# Patient Record
Sex: Male | Born: 1950 | Race: White | Hispanic: No | Marital: Single | Smoking: Former smoker
Health system: Southern US, Community
[De-identification: ages and names within clinical notes are randomized; demographics above are authoritative.]

## PROBLEM LIST (undated history)

## (undated) DIAGNOSIS — E079 Disorder of thyroid, unspecified: Secondary | ICD-10-CM

## (undated) DIAGNOSIS — I10 Essential (primary) hypertension: Secondary | ICD-10-CM

## (undated) HISTORY — PX: REPLACEMENT TOTAL KNEE: SUR1224

## (undated) HISTORY — PX: CHOLECYSTECTOMY: SHX55

## (undated) HISTORY — PX: BACK SURGERY: SHX140

## (undated) HISTORY — PX: ESOPHAGUS SURGERY: SHX626

---

## 2013-07-27 ENCOUNTER — Emergency Department (HOSPITAL_COMMUNITY): Payer: Self-pay

## 2013-07-27 ENCOUNTER — Emergency Department (HOSPITAL_COMMUNITY)
Admission: EM | Admit: 2013-07-27 | Discharge: 2013-07-27 | Disposition: A | Discharge status: Home / Self Care | Payer: Self-pay | Attending: Emergency Medicine | Admitting: Emergency Medicine

## 2013-07-27 ENCOUNTER — Encounter (HOSPITAL_COMMUNITY): Payer: Self-pay | Admitting: Emergency Medicine

## 2013-07-27 DIAGNOSIS — T401X1A Poisoning by heroin, accidental (unintentional), initial encounter: Secondary | ICD-10-CM | POA: Insufficient documentation

## 2013-07-27 DIAGNOSIS — Y9389 Activity, other specified: Secondary | ICD-10-CM | POA: Insufficient documentation

## 2013-07-27 DIAGNOSIS — I10 Essential (primary) hypertension: Secondary | ICD-10-CM | POA: Insufficient documentation

## 2013-07-27 DIAGNOSIS — G4730 Sleep apnea, unspecified: Secondary | ICD-10-CM | POA: Insufficient documentation

## 2013-07-27 DIAGNOSIS — F1910 Other psychoactive substance abuse, uncomplicated: Secondary | ICD-10-CM

## 2013-07-27 DIAGNOSIS — I498 Other specified cardiac arrhythmias: Secondary | ICD-10-CM | POA: Insufficient documentation

## 2013-07-27 DIAGNOSIS — E079 Disorder of thyroid, unspecified: Secondary | ICD-10-CM | POA: Insufficient documentation

## 2013-07-27 DIAGNOSIS — IMO0002 Reserved for concepts with insufficient information to code with codable children: Secondary | ICD-10-CM | POA: Insufficient documentation

## 2013-07-27 DIAGNOSIS — R61 Generalized hyperhidrosis: Secondary | ICD-10-CM | POA: Insufficient documentation

## 2013-07-27 DIAGNOSIS — I499 Cardiac arrhythmia, unspecified: Secondary | ICD-10-CM

## 2013-07-27 DIAGNOSIS — Z79899 Other long term (current) drug therapy: Secondary | ICD-10-CM | POA: Insufficient documentation

## 2013-07-27 DIAGNOSIS — Z87891 Personal history of nicotine dependence: Secondary | ICD-10-CM | POA: Insufficient documentation

## 2013-07-27 DIAGNOSIS — Y929 Unspecified place or not applicable: Secondary | ICD-10-CM | POA: Insufficient documentation

## 2013-07-27 DIAGNOSIS — T401X4A Poisoning by heroin, undetermined, initial encounter: Secondary | ICD-10-CM | POA: Insufficient documentation

## 2013-07-27 HISTORY — DX: Essential (primary) hypertension: I10

## 2013-07-27 HISTORY — DX: Disorder of thyroid, unspecified: E07.9

## 2013-07-27 LAB — CBC WITH DIFFERENTIAL/PLATELET
BASOS PCT: 0 % (ref 0–1)
Basophils Absolute: 0 10*3/uL (ref 0.0–0.1)
Eosinophils Absolute: 0.1 10*3/uL (ref 0.0–0.7)
Eosinophils Relative: 1 % (ref 0–5)
HEMATOCRIT: 43.2 % (ref 39.0–52.0)
HEMOGLOBIN: 15.1 g/dL (ref 13.0–17.0)
LYMPHS ABS: 1.2 10*3/uL (ref 0.7–4.0)
Lymphocytes Relative: 9 % — ABNORMAL LOW (ref 12–46)
MCH: 35 pg — AB (ref 26.0–34.0)
MCHC: 35 g/dL (ref 30.0–36.0)
MCV: 100.2 fL — ABNORMAL HIGH (ref 78.0–100.0)
MONO ABS: 0.6 10*3/uL (ref 0.1–1.0)
MONOS PCT: 5 % (ref 3–12)
Neutro Abs: 11.1 10*3/uL — ABNORMAL HIGH (ref 1.7–7.7)
Neutrophils Relative %: 85 % — ABNORMAL HIGH (ref 43–77)
Platelets: 207 10*3/uL (ref 150–400)
RBC: 4.31 MIL/uL (ref 4.22–5.81)
RDW: 13 % (ref 11.5–15.5)
WBC: 13.1 10*3/uL — ABNORMAL HIGH (ref 4.0–10.5)

## 2013-07-27 LAB — COMPREHENSIVE METABOLIC PANEL WITH GFR
ALT: 36 U/L (ref 0–53)
AST: 29 U/L (ref 0–37)
Albumin: 3.3 g/dL — ABNORMAL LOW (ref 3.5–5.2)
Alkaline Phosphatase: 95 U/L (ref 39–117)
BUN: 19 mg/dL (ref 6–23)
CO2: 31 meq/L (ref 19–32)
Calcium: 9.3 mg/dL (ref 8.4–10.5)
Chloride: 102 meq/L (ref 96–112)
Creatinine, Ser: 1.64 mg/dL — ABNORMAL HIGH (ref 0.50–1.35)
GFR calc Af Amer: 51 mL/min — ABNORMAL LOW
GFR calc non Af Amer: 44 mL/min — ABNORMAL LOW
Glucose, Bld: 136 mg/dL — ABNORMAL HIGH (ref 70–99)
Potassium: 4.6 meq/L (ref 3.7–5.3)
Sodium: 141 meq/L (ref 137–147)
Total Bilirubin: 0.8 mg/dL (ref 0.3–1.2)
Total Protein: 6.3 g/dL (ref 6.0–8.3)

## 2013-07-27 LAB — RAPID URINE DRUG SCREEN, HOSP PERFORMED
Amphetamines: POSITIVE — AB
Barbiturates: NOT DETECTED
Benzodiazepines: NOT DETECTED
Cocaine: POSITIVE — AB
Opiates: POSITIVE — AB
Tetrahydrocannabinol: POSITIVE — AB

## 2013-07-27 LAB — I-STAT CG4 LACTIC ACID, ED: Lactic Acid, Venous: 0.76 mmol/L (ref 0.5–2.2)

## 2013-07-27 LAB — MAGNESIUM: Magnesium: 1.8 mg/dL (ref 1.5–2.5)

## 2013-07-27 LAB — ETHANOL: Alcohol, Ethyl (B): <11 mg/dL (ref 0–11)

## 2013-07-27 LAB — TROPONIN I: Troponin I: <0.3 ng/mL

## 2013-07-27 MED ORDER — MAGNESIUM SULFATE IN D5W 10-5 MG/ML-% IV SOLN
1.0000 g | Freq: Once | INTRAVENOUS | Status: AC
  Administered 2013-07-27: 1 g via INTRAVENOUS
  Filled 2013-07-27: qty 100

## 2013-07-27 MED ORDER — SODIUM CHLORIDE 0.9 % IV BOLUS (SEPSIS)
1000.0000 mL | Freq: Once | INTRAVENOUS | Status: AC
  Administered 2013-07-27: 1000 mL via INTRAVENOUS

## 2013-07-27 NOTE — ED Notes (Signed)
Bed: [id number] Expected date:  Expected time:  Means of arrival:  Comments: EMS Heroin Overdose

## 2013-07-27 NOTE — ED Provider Notes (Signed)
CSN: [id number]     Arrival date & time 07/27/13  0024 History   First MD Initiated Contact with Patient 07/27/13 0048     Chief Complaint  Patient presents with  . Heroin Overdose      (Consider location/radiation/quality/duration/timing/severity/associated sxs/prior Treatment) HPI 63 year old male presents to emergency department via EMS after a heroin overdose.  Patient was accompanied by a friend who is with him at that time.  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  This to using heroin.  Snorted, it tonight.  Patient's friend reports he was unconscious, and barely breathing, turned blue.  Patient was given Narcan 1 mg, and is now back to his baseline.  Patient noted to have bigeminy on the monitor.  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  He denies any chest pain, shortness of breath.  He is diaphoretic. Past Medical History  Diagnosis Date  . Hypertension   . Thyroid disease    Past Surgical History  Procedure Laterality Date  . Esophagus surgery    . Replacement total knee Right   . Cholecystectomy    . Back surgery     History reviewed. No pertinent family history. History  Substance Use Topics  . Smoking status: Former Games developer  . Smokeless tobacco: Never Used  . Alcohol Use: Yes    Review of Systems  See History of Present Illness; otherwise all other systems are reviewed and negative   Allergies  Review of patient's allergies indicates no known allergies.  Home Medications   Current Outpatient Rx  Name  Route  Sig  Dispense  Refill  . citalopram (CELEXA) 40 MG tablet   Oral   Take 40 mg by mouth daily.         Marland Kitchen dextroamphetamine (DEXTROSTAT) 10 MG tablet   Oral   Take 10 mg by mouth 4 (four) times daily.         Marland Kitchen levothyroxine (SYNTHROID, LEVOTHROID) 75 MCG tablet   Oral   Take 75 mcg by mouth daily before breakfast.         . lisinopril-hydrochlorothiazide (PRINZIDE,ZESTORETIC) 20-25 MG per tablet   Oral   Take 1 tablet by mouth daily.         .  methylPREDNISolone (MEDROL DOSEPAK) 4 MG tablet   Oral   Take 4 mg by mouth 2 (two) times daily. follow package directions.         . tadalafil (CIALIS) 5 MG tablet   Oral   Take 5 mg by mouth daily as needed for erectile dysfunction.          BP 128/73  Pulse 48  Temp(Src) 98.8 F (37.1 C) (Oral)  Resp 12  SpO2 95% Physical Exam  Nursing note and vitals reviewed. Constitutional: He is oriented to person, place, and time. He appears well-developed and well-nourished.  HENT:  Head: Normocephalic and atraumatic.  Right Ear: External ear normal.  Left Ear: External ear normal.  Nose: Nose normal.  Mouth/Throat: Oropharynx is clear and moist.  Eyes: Conjunctivae and EOM are normal. Pupils are equal, round, and reactive to light.  Neck: Normal range of motion. Neck supple. No JVD present. No tracheal deviation present. No thyromegaly present.  Cardiovascular: Normal rate, regular rhythm, normal heart sounds and intact distal pulses.  Exam reveals no gallop and no friction rub.   No murmur heard. Irregular rhythm  Pulmonary/Chest: Effort normal and breath sounds normal. No stridor. No respiratory distress. He has no wheezes. He has no rales.  He exhibits no tenderness.  Abdominal: Soft. Bowel sounds are normal. He exhibits no distension and no mass. There is no tenderness. There is no rebound and no guarding.  Musculoskeletal: Normal range of motion. He exhibits no edema and no tenderness.  Lymphadenopathy:    He has no cervical adenopathy.  Neurological: He is alert and oriented to person, place, and time. He has normal reflexes. No cranial nerve deficit. He exhibits normal muscle tone. Coordination normal.  Skin: Skin is warm. No rash noted. He is diaphoretic. No erythema. No pallor.  Psychiatric: He has a normal mood and affect. His behavior is normal. Judgment and thought content normal.    ED Course  Procedures (including critical care time) Labs Review Labs Reviewed  CBC  WITH DIFFERENTIAL - Abnormal; Notable for the following:    WBC 13.1 (*)    MCV 100.2 (*)    MCH 35.0 (*)    Neutrophils Relative % 85 (*)    Neutro Abs 11.1 (*)    Lymphocytes Relative 9 (*)    All other components within normal limits  COMPREHENSIVE METABOLIC PANEL - Abnormal; Notable for the following:    Glucose, Bld 136 (*)    Creatinine, Ser 1.64 (*)    Albumin 3.3 (*)    GFR calc non Af Amer 44 (*)    GFR calc Af Amer 51 (*)    All other components within normal limits  URINE RAPID DRUG SCREEN (HOSP PERFORMED) - Abnormal; Notable for the following:    Opiates POSITIVE (*)    Cocaine POSITIVE (*)    Amphetamines POSITIVE (*)    Tetrahydrocannabinol POSITIVE (*)    All other components within normal limits  TROPONIN I  MAGNESIUM  ETHANOL  I-STAT CG4 LACTIC ACID, ED   Imaging Review Dg Chest 2 View  07/27/2013   CLINICAL DATA:  Heroin overdose.  Chest pain.  EXAM: CHEST  2 VIEW  COMPARISON:  No priors.  FINDINGS: Lung volumes are normal. No consolidative airspace disease. No pleural effusions. No pneumothorax. No pulmonary nodule or mass noted. Pulmonary vasculature and the cardiomediastinal silhouette are within normal limits.  IMPRESSION: No radiographic evidence of acute cardiopulmonary disease.   Electronically Signed   By: Trudie Reedaniel  Entrikin M.D.   On: 07/27/2013 06:21     EKG Interpretation None      Date: 07/27/2013  Rate: 114  Rhythm: sinus tachycardia with bigeminy  QRS Axis: normal  Intervals: bigeminy  ST/T Wave abnormalities: nonspecific ST changes  Conduction Disutrbances:bigeminy  Narrative Interpretation:   Old EKG Reviewed: none available    MDM   Final diagnoses:  Accidental heroin overdose  Polysubstance abuse  Bigeminy  Sleep apnea  Diaphoresis    63 year old gentleman status post heroin overdose.  Patient denies any coingestions, no use of methadone.  He has persistent bigeminy.  We'll check electrolytes, replete as necessary.  6:04  AM Magnesium slightly low, repleted.  Bigeminy has improved to frequent PVCs, patient has history of same.  Patient noted to have low oxygen saturations.  Ambulated and oxygen noted to be low 90s.  Will check chest x-ray for possible aspiration.  Patient has history of apnea, and does not wear his CPAP.  Patient also noted be very diaphoretic.  Patient was questioned extensively about drugs, alcohol, or any cough.  Other causes for diaphoresis.  He is adamant that he does not take any prescription medications not prescribed to him, and this was his first trial of heroin.  He denies  alcohol withdrawal problems.  6:34 AM Drug screen positive for cocaine, amphetamines, thc, opiates.  Pt does admit to smoking marijuana, but denies cocaine use.  Pt still very diaphoretic.  He reports now that he has had this problem for some time, and has addressed it with his pcm.  CXR clear.  Will d/c home.    Olivia Mackie, MD 07/27/13 [phone]

## 2013-07-27 NOTE — ED Notes (Signed)
Pt able to ambulate in the hall--- pt's O2 sat 90-95% on room air while ambulating.

## 2013-07-27 NOTE — ED Notes (Signed)
Brought in by EMS from pt's lady friend after pt took "overdose of heroin"---- lady friend found pt unconscious and "not breathing".  Per EMS, pt's lady friend reported that pt took an unknown amount of heroin. Pt was unconscious with respirations 3 breaths per minute on EMS' arrival--- pt was given Narcan 1 mg IV; pt woke up and became completely alert and oriented x 4 after the Narcan.  Pt presents to ED A/Ox4, in no s/s apparent distress noted.

## 2013-07-27 NOTE — Discharge Instructions (Signed)
STOP USING DRUGS.     Cardiac Arrhythmia Your heart is a muscle that works to pump blood through your body by regular contractions. The beating of your heart is controlled by a system of special pacemaker cells. These cells control the electrical activity of the heart. When the system controlling this regular beating is disturbed, a heart rhythm abnormality (arrhythmia) results. WHEN YOUR HEART SKIPS A BEAT One of the most common and least serious heart arrhythmias is called an ectopic or premature atrial heartbeat (PAC). This may be noticed as a small change in your regular pulse. A PAC originates from the top part (atrium) of the heart. Within the right atrium, the SA node is the area that normally controls the regularity of the heart. PACs occur in heart tissue outside of the SA node region. You may feel this as a skipped beat or heart flutter, especially if several occur in succession or occur frequently.  Another arrhythmia is ventricular premature complex (VCP or PVC). These extra beats start out in the bottom, more muscular chambers of the heart. In most cases a PVC is harmless. If there are underlying causes that are making the heart irritable such as an overactive thyroid or a prior heart attack PVCs may be of more concern. In a few cases, medications to control the heart rhythm may be prescribed. Things to try at home:  Cut down or avoid alcohol, tobacco and caffeine.  Get enough sleep.  Reduce stress.  Exercise more. WHEN THE HEART BEATS TOO FAST Atrial tachycardia is a fast heart rate, which starts out in the atrium. It may last from minutes to much longer. Your heart may beat 140 to 240 times per minute instead of the normal 60 to 100.  Symptoms include a worried feeling (anxiety) and a sense that your heart is beating fast and hard.  You may be able to stop the fast rate by holding your breath or bearing down as if you were going to have a bowel movement.  This type of fast rate  is usually not dangerous. Atrial fibrillation and atrial flutter are other fast rhythms that start in the atria. Both conditions keep the atria from filling with enough blood so the heart does not work well.  Symptoms include feeling light-headed or faint.  These fast rates may be the result of heart damage or disease. Too much thyroid hormone may play a role.  There may be no clear cause or it may be from heart disease or damage.  Medication or a special electrical treatment (cardioversion) may be needed to get the heart beating normally. Ventricular tachycardia is a fast heart rate that starts in the lower muscular chambers (ventricles) This is a serious disorder that requires treatment as soon as possible. You need someone else to get and use a small defibrillator.  Symptoms include collapse, chest pain, or being short of breath.  Treatment may include medication, procedures to improve blood flow to the heart, or an implantable cardiac defibrillator (ICD). DIAGNOSIS   A cardiogram (EKG or ECG) will be done to see the arrhythmia, as well as lab tests to check the underlying cause.  If the extra beats or fast rate come and go, you may wear a Holter monitor that records your heart rate for a longer period of time. SEEK MEDICAL CARE IF:  You have irregular or fast heartbeats (palpitations).  You experience skipped beats.  You develop lightheadedness.  You have chest discomfort.  You have shortness of breath.  You have more frequent episodes, if you are already being treated. SEEK IMMEDIATE MEDICAL CARE IF:   You have severe chest pain, especially if the pain is crushing or pressure-like and spreads to the arms, back, neck, or jaw, or if you have sweating, feeling sick to your stomach (nausea), or shortness of breath. THIS IS AN EMERGENCY. Do not wait to see if the pain will go away. Get medical help at once. Call 911 or 0 (operator). DO NOT drive yourself to the hospital.  You  feel dizzy or faint.  You have episodes of previously documented atrial tachycardia that do not resolve with the techniques your caregiver has taught you.  Irregular or rapid heartbeats begin to occur more often than in the past, especially if they are associated with more pronounced symptoms or of longer duration. Document Released: 04/21/2005 Document Revised: 07/14/2011 Document Reviewed: 12/08/2007 [facility] Patient Information 2014 [place], [place].  Diaphoresis Sweating is controlled by our nervous system. Sweat glands are found in the skin throughout the body. They exist in higher numbers in the skin of the hands, feet, armpits, and the genital region. Sweating occurs normally when the temperature of your body goes up. Diaphoresis means profuse sweating or perspiration due to an underlying medical condition or an external factor (such as medicines). Hyperhidrosis means excessive sweating that is not usually due to an underlying medical condition, on areas such as the palms, soles, or armpits. Other areas of the body may also be affected. Hyperhidrosis usually begins in childhood or early adolescence. It increases in severity through puberty and into adulthood. Sweaty palms are the most common problem and the most bothersome to people who have hyperhidrosis. CAUSES  Sweating is normally seen with exercise or being in hot surroundings. Not sweating in these conditions would be harmful. Stressful situations can also cause sweating. In some people, stimulation of the sweat glands during stress is overactive. Talking to strangers or shaking someone's hand can produce profuse sweating. Causes of sweating include:  Emotional upset.  Low blood pressure.  Low blood sugar.  Heart problems.  Low blood cell counts.  Certain pain relieving medicines.  Exercise.  Alcohol.  Infection.  Caffeine.  Spicy foods.  Hot flashes.  Overactive thyroid.  Illegal drug use, such as cocaine and  amphetamine.  Use of medicines that stimulate parts of the nervous system.  A tumor (pheochromocytoma).  Withdrawal from some medicines or alcohol. DIAGNOSIS  Your caregiver needs to be consulted to make sure excessive sweating is not caused by another condition. Further testing may need to be done. TREATMENT   When hyperhidrosis is caused by another condition, that condition should be treated.  If menopause is the cause, you may wish to talk to your caregiver about estrogen replacement.  If the hyperhidrosis is a natural happening of the way your body works, certain antiperspirants may help.  If medicines do not work, injections of botulinum toxin type A are sometimes used for underarm sweating.  Your caregiver can usually help you with this problem. Document Released: 11/11/2004 Document Revised: 07/14/2011 Document Reviewed: 05/29/2008 [facility] Patient Information 2014 [place], [place].   Drug Abuse and Addiction in Sports There are many types of drugs that one may become addicted to including illegal drugs (marijuana, cocaine, amphetamines, hallucinogens, and narcotics), prescription drugs (hydrocodone, codeine, and alprazolam), and other chemicals such as alcohol or nicotine. Two types of addiction exist: physical and emotional. Physical addiction usually occurs after prolonged use of a drug. However, some drugs may only  take a couple uses before addiction can occur. Physical addiction is marked by withdrawal symptoms, in which the person experiences negative symptoms such as sweat, anxiety, tremors, hallucinations, or cravings in the absence of using the drug. Emotional dependence is the psychological desire for the "high" that the drugs produce when taken. SYMPTOMS   Inattentiveness.  Negligence.  Forgetfulness.  Insomnia.  Mood swings. RISK INCREASES WITH:   Family history of addiction.  Personal history of addictive personality. Studies have shown that risktakers,  which many athletes are, have a higher risk of addiction. PREVENTION The only adequate prevention of drug abuse is abstinence from drugs. TREATMENT  The first step in quitting substance abuse is recognizing the problem and realizing that one has the power to change. Quitting requires a plan and support from others. It is often necessary to seek medical assistance. Caregivers are available to offer counseling, and for certain cases, medicine to diminish the physical symptoms of withdrawal. Many organizations exist such as Alcoholics Anonymous, Narcotics Anonymous, or the [employer] on Alcoholism that offer support for individuals who have chosen to quit their habits. Document Released: 04/21/2005 Document Revised: 07/14/2011 Document Reviewed: 08/03/2008 [facility] Patient Information 2014 [place], [place].  Narcotic Overdose A narcotic overdose is the misuse or overuse of a narcotic drug. A narcotic overdose can make you pass out and stop breathing. If you are not treated right away, this can cause permanent brain damage or stop your heart. Medicine may be given to reverse the effects of an overdose. If so, this medicine may bring on withdrawal symptoms. The symptoms may be abdominal cramps, throwing up (vomiting), sweating, chills, and nervousness. Injecting narcotics can cause more problems than just an overdose. AIDS, hepatitis, and other very serious infections are transmitted by sharing needles and syringes. If you decide to quit using, there are medicines which can help you through the withdrawal period. Trying to quit all at once on your own can be uncomfortable, but not life-threatening. Call your caregiver, Narcotics Anonymous, or any drug and alcohol treatment program for further help.  Document Released: 05/29/2004 Document Revised: 07/14/2011 Document Reviewed: 03/23/2009 [facility] Patient Information 2014 [place], [place].

## 2014-09-04 IMAGING — CR DG CHEST 2V
2 series · 2 of 2 positions shown · non-contrast
Comparison: No priors.

CLINICAL DATA: Heroin overdose.  Chest pain.

EXAM:
CHEST  2 VIEW

[w chest pa]
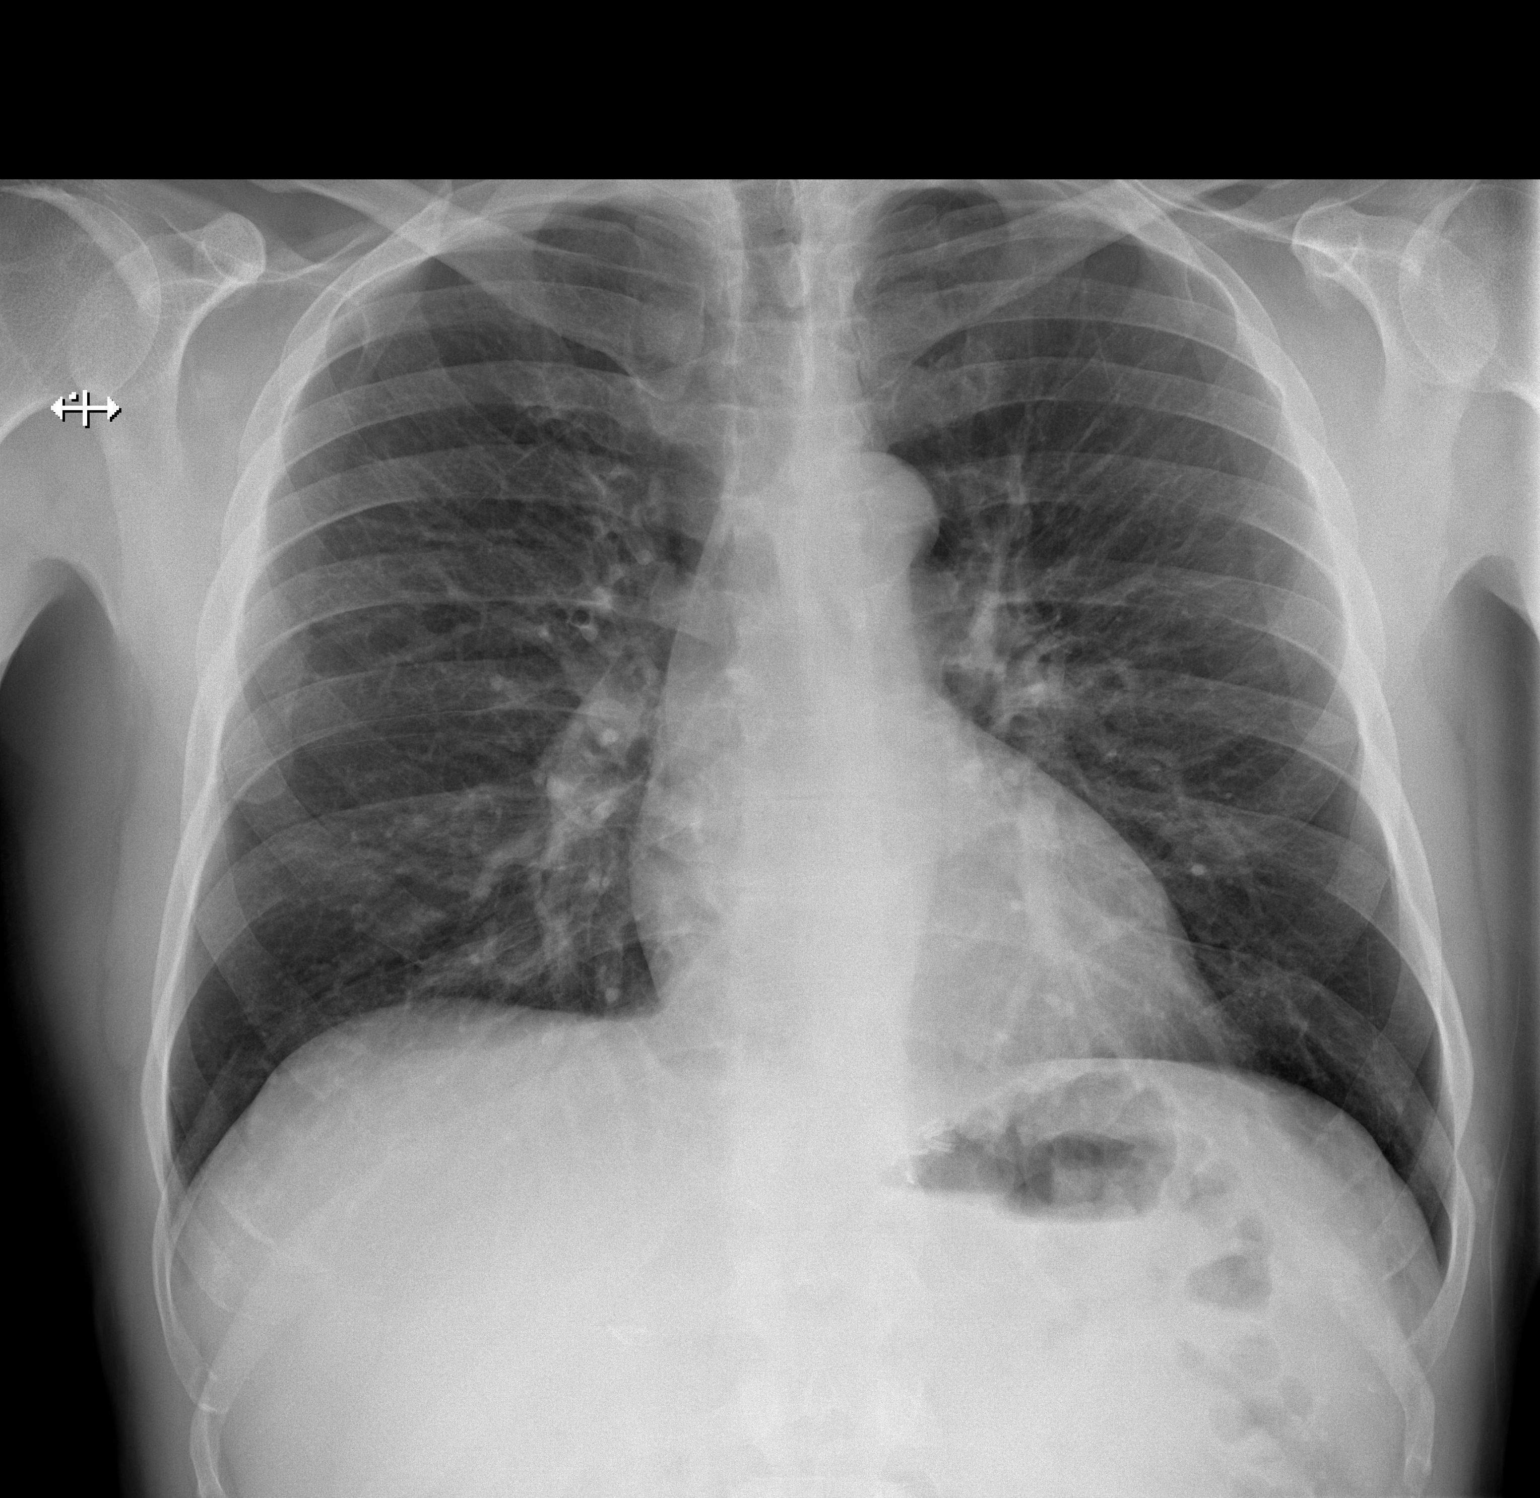

[w chest lat]
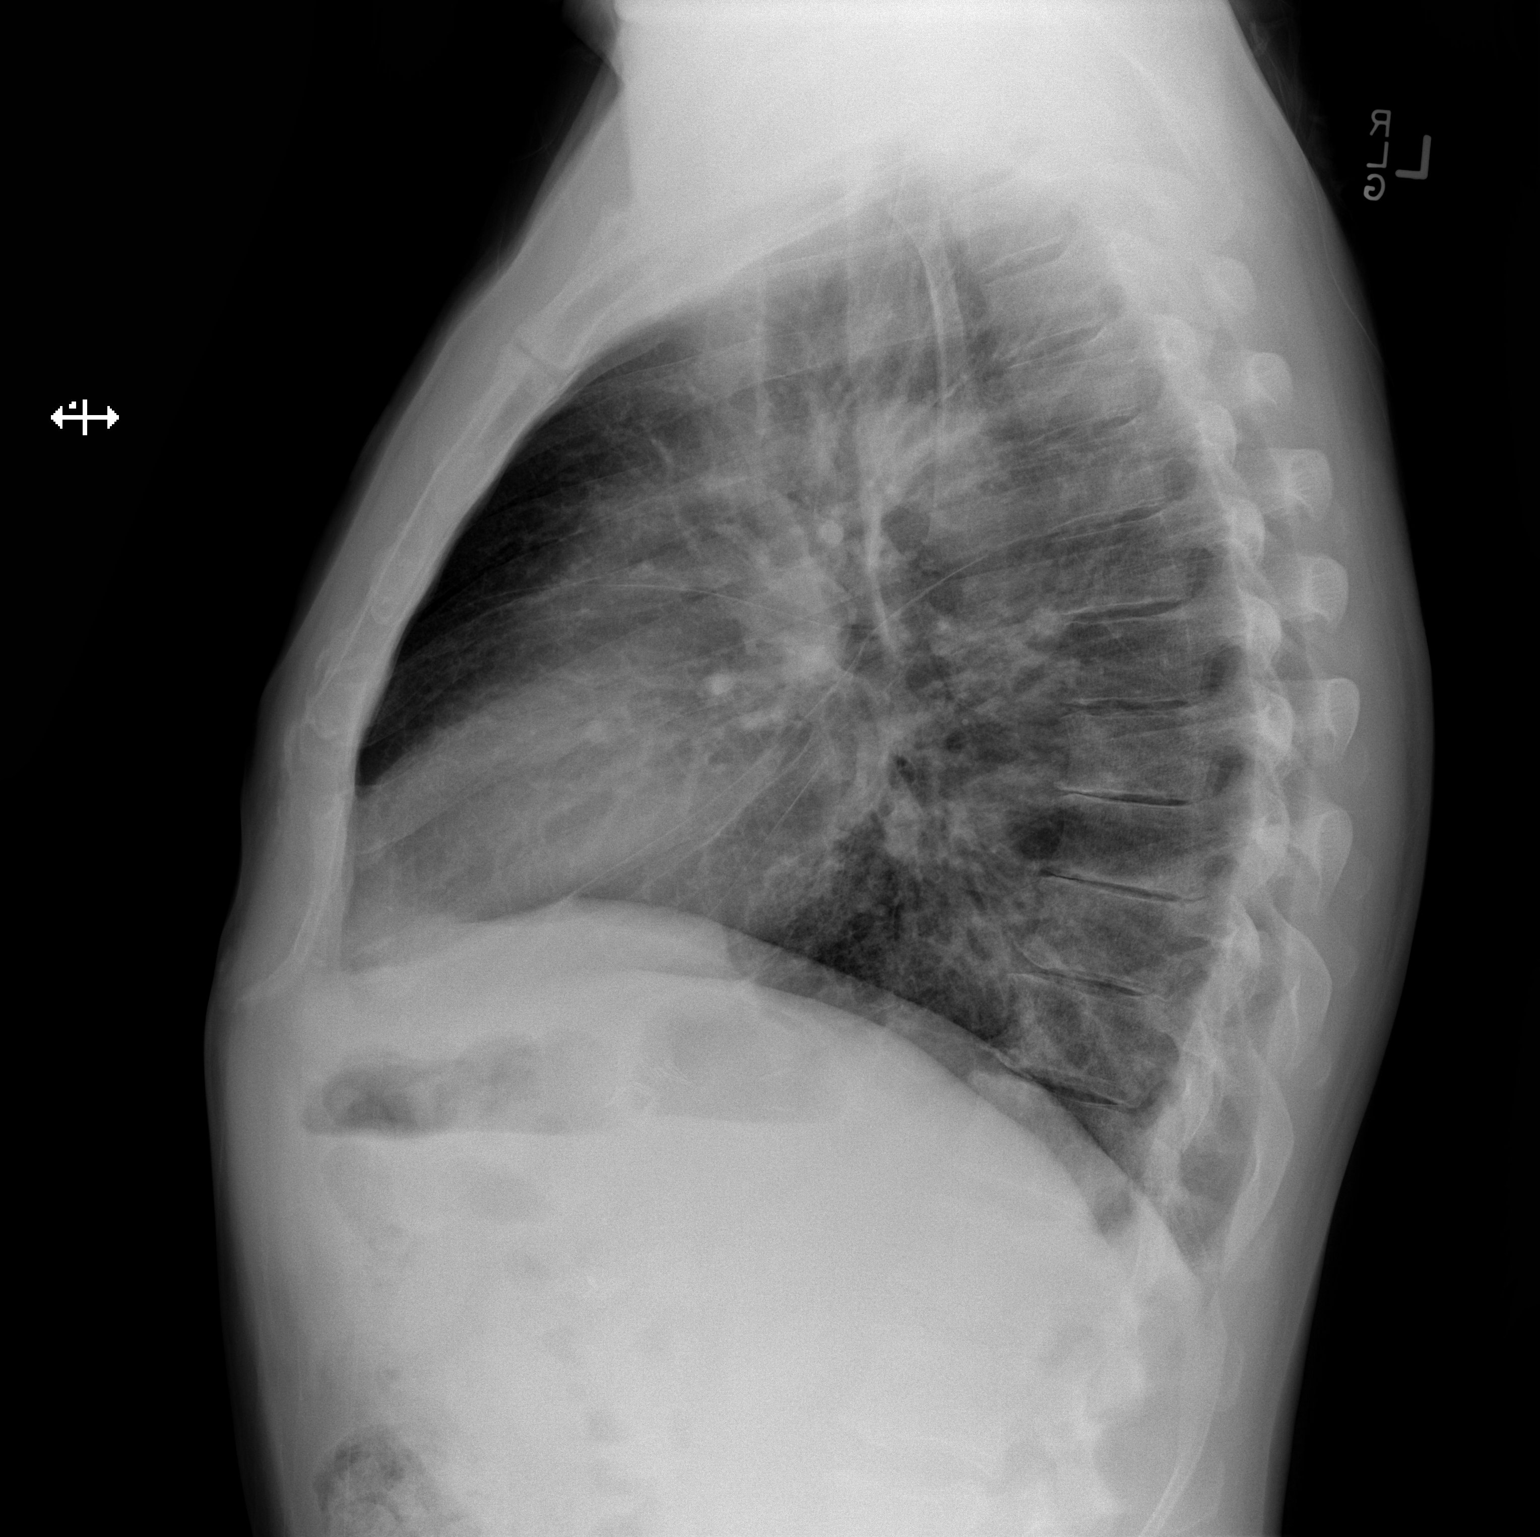

[2 of 2 positions shown; findings below may reference images not displayed]

FINDINGS: Lung volumes are normal. No consolidative airspace disease. No
pleural effusions. No pneumothorax. No pulmonary nodule or mass
noted. Pulmonary vasculature and the cardiomediastinal silhouette
are within normal limits.
IMPRESSION: No radiographic evidence of acute cardiopulmonary disease.
